# Patient Record
Sex: Female | Born: 2000
Health system: Southern US, Community
[De-identification: ages and names within clinical notes are randomized; demographics above are authoritative.]

---
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
## 2018-02-18 ENCOUNTER — Ambulatory Visit (INDEPENDENT_AMBULATORY_CARE_PROVIDER_SITE_OTHER): Payer: 59 | Admitting: Sports Medicine

## 2018-02-18 ENCOUNTER — Encounter: Payer: Self-pay | Admitting: Sports Medicine

## 2018-02-18 VITALS — BP 118/80 | Ht 62.0 in | Wt 160.0 lb

## 2018-02-18 DIAGNOSIS — S86899D Other injury of other muscle(s) and tendon(s) at lower leg level, unspecified leg, subsequent encounter: Secondary | ICD-10-CM | POA: Diagnosis not present

## 2018-02-18 NOTE — Progress Notes (Signed)
  Megan Herrera - 17 y.o. female MRN [id number]  Date of birth: 07/21/2000    SUBJECTIVE:      Chief Complaint:/ HPI:  The patient is a 17 year old female entering her second year of the [employer] who presents for orthotics.  She has been wearing these for several years for her shinsplints.  She reports great improvement with the orthotics.   ROS:     Patient denies any lower extremity pain, swelling, bruising, erythema.  No numbness or tingling  PERTINENT  PMH / PSH FH / / SH:  Past Medical, Surgical, Social, and Family History Reviewed & Updated in the EMR.  Pertinent findings include:  Negative no history of smoking  OBJECTIVE: BP 112/82   Ht 5\' 7"  (1.702 m)   Wt 160 lb (72.6 kg)   BMI 25.06 kg/m   Physical Exam:  Vital signs are reviewed.  GEN: Alert and oriented, NAD Pulm: Breathing unlabored PSY: normal mood, congruent affect  MSK: Moderate pes planus bilaterally  ASSESSMENT & PLAN:  1.  Custom orthotics - Patient was fitted for a : standard, cushioned, semi-rigid orthotic. The orthotic was heated and afterward the patient stood on the orthotic blank positioned on the orthotic stand. The patient was positioned in subtalar neutral position and 10 degrees of ankle dorsiflexion in a weight bearing stance. After completion of molding, a stable base was applied to the orthotic blank. The blank was ground to a stable position for weight bearing. Size: 9 Base: blue EVA Posting: none Additional orthotic padding: none  Total time spent with the patient was 30 minutes with greater than 50% of the time spent in face-to-face consultation discussing orthotic construction, instruction, and sitting. Gait was neutral with orthotics in place. Patient found them to be comfortable. Follow-up as needed.

## 2018-07-01 ENCOUNTER — Ambulatory Visit: Payer: Self-pay

## 2018-07-01 ENCOUNTER — Ambulatory Visit (INDEPENDENT_AMBULATORY_CARE_PROVIDER_SITE_OTHER): Payer: 59 | Admitting: Sports Medicine

## 2018-07-01 ENCOUNTER — Encounter: Payer: Self-pay | Admitting: Sports Medicine

## 2018-07-01 VITALS — BP 114/80 | Ht 67.0 in | Wt 160.0 lb

## 2018-07-01 DIAGNOSIS — M25521 Pain in right elbow: Secondary | ICD-10-CM

## 2018-07-01 DIAGNOSIS — S86899D Other injury of other muscle(s) and tendon(s) at lower leg level, unspecified leg, subsequent encounter: Secondary | ICD-10-CM

## 2018-07-01 NOTE — Progress Notes (Signed)
  Sol BlazingMargot A Jordan - 17 y.o. 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  She been wearing/require some time due to shinsplints.  Excellent response to sports orthotics Needs thin orthotic for marching shoes   ROS:     See HPI  PERTINENT  PMH / PSH FH / / SH:  Past Medical, Surgical, Social, and Family History Reviewed & Updated in the EMR.    OBJECTIVE: BP 114/80   Ht 5\' 7"  (1.702 m)   Wt 160 lb (72.6 kg)   BMI 25.06 kg/m   Physical Exam:  Vital signs are reviewed.  GEN: Alert and oriented, NAD Pulm: Breathing unlabored PSY: normal mood, congruent affect   Patient was fitted for a : standard, semi-rigid orthotic for dress shoes The orthotic was heated and afterward the patient stood on the orthotic blank positioned on the orthotic stand. The patient was positioned in subtalar neutral position and 10 degrees of ankle dorsiflexion in a weight bearing stance. After completion of molding, a stable base was applied to the orthotic blank. The blank was ground to a stable position for weight bearing. Size: 9 Base: 1/32 blue foam placed over heel for additional cushioning Posting: None Additional orthotic padding: None  Additionally, her old pair of orthotics was repaired, approximately a quarter sized hole was wearing into the orthotic at the head of the first metatarsal.  A 1/32 thick piece of blue foam was cut to fill in this hole.    ASSESSMENT & PLAN:  1.  History of medial tibial stress syndrome- custom orthotics created today as above  Total time spent with the patient was 25 minutes with greater than 50% of the time spent in face-to-face consultation discussing orthotic construction, instruction, and sitting. Gait was neutral with orthotics in place. Patient found them to be comfortable. Follow-up as needed.  I observed and examined the patient with the [facility]  Fellow and agree with assessment and plan.  Note reviewed and modified by me. Enid BaasKarl Nastasha Reising, MD

## 2018-08-30 DIAGNOSIS — J111 Influenza due to unidentified influenza virus with other respiratory manifestations: Secondary | ICD-10-CM | POA: Diagnosis not present

## 2018-12-24 ENCOUNTER — Ambulatory Visit (INDEPENDENT_AMBULATORY_CARE_PROVIDER_SITE_OTHER)

## 2018-12-24 ENCOUNTER — Encounter: Payer: Self-pay | Admitting: Sports Medicine

## 2018-12-24 ENCOUNTER — Other Ambulatory Visit: Payer: Self-pay

## 2018-12-24 ENCOUNTER — Ambulatory Visit (INDEPENDENT_AMBULATORY_CARE_PROVIDER_SITE_OTHER): Admitting: Sports Medicine

## 2018-12-24 DIAGNOSIS — M222X1 Patellofemoral disorders, right knee: Secondary | ICD-10-CM | POA: Diagnosis not present

## 2018-12-24 MED ORDER — MELOXICAM 15 MG PO TABS: ORAL_TABLET | ORAL | 3 refills | Status: AC

## 2018-12-24 MED FILL — MELOXICAM 15 MG TABLET: 15 | 30 days supply | Qty: 30 | Fill #0

## 2018-12-24 NOTE — Patient Instructions (Signed)
Hip abductors are mildly weak on the right. Needs to work aggressively on vastus medialis rehabilitation as well as hip abductor rehabilitation. Adding meloxicam for 2 weeks. Reaction knee brace. X-rays today. Raise bicycle seat so that knees do not past 90 degrees.

## 2018-12-24 NOTE — Assessment & Plan Note (Signed)
Hip abductors are mildly weak on the right. Needs to work aggressively on vastus medialis rehabilitation as well as hip abductor rehabilitation. Adding meloxicam for 2 weeks. Reaction knee brace. X-rays today. Raise bicycle seat so that knees do not past 90 degrees. She is going to be going back to [place] in a week, she is in the [employer]. She can touch base with me in 1 month to let me know how things are going.

## 2018-12-24 NOTE — Progress Notes (Signed)
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  She is considering flight training but not really sure what she wants to do when she joins the [employer].  She has been doing a lot of biking, and unfortunately has developed pain of the anterior knee, worse with going up and down steps, squatting, and biking.  She tends to bike with her seat low enough where her knees fully by past 90 degrees, she has not had any trauma, only minimal swelling, occasional squeaking sensations.  No mechanical symptoms.  I reviewed the past medical history, family history, social history, surgical history, and allergies today and no changes were needed.  Please see the problem list section below in epic for further details.  Past Medical History: No past medical history on file. Past Surgical History: No past surgical history on file. Social History: Social History   Socioeconomic History  . Marital status: Single    Spouse name: Not on file  . Number of children: Not on file  . Years of education: Not on file  . Highest education level: Not on file  Occupational History  . Not on file  Social Needs  . Financial resource strain: Not on file  . Food insecurity    Worry: Not on file    Inability: Not on file  . Transportation needs    Medical: Not on file    Non-medical: Not on file  Tobacco Use  . Smoking status: Never Smoker  . Smokeless tobacco: Never Used  Substance and Sexual Activity  . Alcohol use: No  . Drug use: No  . Sexual activity: Not on file  Lifestyle  . Physical activity    Days per week: Not on file    Minutes per session: Not on file  . Stress: Not on file  Relationships  . Social Herbalist on phone: Not on file    Gets together: Not on file    Attends religious service: Not on file    Active member of club or organization: Not on file     Attends meetings of clubs or organizations: Not on file    Relationship status: Not on file  Other Topics Concern  . Not on file  Social History Narrative  . Not on file   Family History: No family history on file. Allergies: Allergies  Allergen Reactions  . Cephalosporins    Medications: See med rec.  Review of Systems: No headache, visual changes, nausea, vomiting, diarrhea, constipation, dizziness, abdominal pain, skin rash, fevers, chills, night sweats, swollen lymph nodes, weight loss, chest pain, body aches, joint swelling, muscle aches, shortness of breath, mood changes, visual or auditory hallucinations.  Objective:    General: Well Developed, well nourished, and in no acute distress.  Neuro: Alert and oriented x3, extra-ocular muscles intact, sensation grossly intact.  HEENT: Normocephalic, atraumatic, pupils equal round reactive to light, neck supple, no masses, no lymphadenopathy, thyroid nonpalpable.  Skin: Warm and dry, no rashes noted.  Cardiac: Regular rate and rhythm, no murmurs rubs or gallops.  Respiratory: Clear to auscultation bilaterally. Not using accessory muscles, speaking in full sentences.  Abdominal: Soft, nontender, nondistended, positive bowel sounds, no masses, no organomegaly.  Right Knee: Normal to inspection with no erythema or effusion or obvious bony abnormalities. Palpation normal with no warmth or joint line  tenderness or patellar tenderness or condyle tenderness. ROM normal in flexion and extension and lower leg rotation. Ligaments with solid consistent endpoints including ACL, PCL, LCL, MCL. Negative Mcmurray's and provocative meniscal tests. Painful patellar compression with crepitus. Patellar and quadriceps tendons unremarkable. Hamstring and quadriceps strength is normal. Weak hip abductors on the right   Impression and Recommendations:    The patient was counselled, risk factors were discussed, anticipatory guidance given.   Patellofemoral syndrome, right Hip abductors are mildly weak on the right. Needs to work aggressively on vastus medialis rehabilitation as well as hip abductor rehabilitation. Adding meloxicam for 2 weeks. Reaction knee brace. X-rays today. Raise bicycle seat so that knees do not past 90 degrees. She is going to [place] to [place] in [place], she is in the [employer]. She can touch base with me in 1 month to let me know how things are going.   ___________________________________________ Ihor Austinhomas J. Benjamin Stainhekkekandam, M.D., ABFM., CAQSM. Primary Care and Sports Medicine [facility]  Adjunct Professor of Family Medicine  [employer]

## 2018-12-30 ENCOUNTER — Ambulatory Visit (INDEPENDENT_AMBULATORY_CARE_PROVIDER_SITE_OTHER): Admitting: Rehabilitative and Restorative Service Providers"

## 2018-12-30 ENCOUNTER — Encounter: Payer: Self-pay | Admitting: Rehabilitative and Restorative Service Providers"

## 2018-12-30 ENCOUNTER — Other Ambulatory Visit: Payer: Self-pay

## 2018-12-30 DIAGNOSIS — G8929 Other chronic pain: Secondary | ICD-10-CM | POA: Diagnosis not present

## 2018-12-30 DIAGNOSIS — M25561 Pain in right knee: Secondary | ICD-10-CM | POA: Diagnosis not present

## 2018-12-30 DIAGNOSIS — R29898 Other symptoms and signs involving the musculoskeletal system: Secondary | ICD-10-CM

## 2018-12-30 NOTE — Therapy (Addendum)
[facility] [place], [place], [address] Phone: [phone]   Fax:  [phone]  Physical Therapy Evaluation  Patient Details  Name: Megan Herrera MRN: [id number] Date of Birth: 2000-10-13 Referring Provider (PT): Dr Dianah Field    Encounter Date: 12/30/2018  PT End of Session - 12/30/18 1450    Visit Number  1    Number of Visits  1    Date for PT Re-Evaluation  12/30/18    PT Start Time  1205    PT Stop Time  1308   PT Time Calculation (min)  63 min    Activity Tolerance  Patient tolerated treatment well       History reviewed. No pertinent past medical history.  History reviewed. No pertinent surgical history.  There were no vitals filed for this visit.   Subjective Assessment - 12/30/18 1211    Subjective  Some pain in Rt knee in the fall after a 90 mile ride. She backed off and had some continued pain. She repeated a hard ride ~ 5 weeks ago with increased pain. Patient has tried rest from biking and running for short periods without significant improvement. Symptoms have persisted and worsened.    Patient Stated Goals  improve pain and learn exercises    Currently in Pain?  Yes    Pain Score  0-No pain    Pain Location  Knee    Pain Orientation  Right    Pain Descriptors / Indicators  Dull;Throbbing    Pain Type  Acute pain;Chronic pain    Pain Onset  More than a month ago    Pain Frequency  Intermittent    Aggravating Factors   biking; running; push ups; wt bearing Rt LE    Pain Relieving Factors  meds; ice; avoiding activities that irritate knee         [facility] PT Assessment - 12/30/18 0001      Assessment   Medical Diagnosis  Rt patellofemoral syndrome     Referring Provider (PT)  Dr Dianah Field     Onset Date/Surgical Date  11/16/18    Hand Dominance  Left    Next MD Visit  PRN     Prior Therapy  some in fall at school       Precautions   Precautions  None      Restrictions    Weight Bearing Restrictions  No      Balance Screen   Has the patient fallen in the past 6 months  No    Has the patient had a decrease in activity level because of a fear of falling?   No    Is the patient reluctant to leave their home because of a fear of falling?   No      Prior Function   Level of Independence  Independent    Vocation  Student    Vocation Requirements  biking team; running, PT       Observation/Other Assessments   Focus on Therapeutic Outcomes (FOTO)   33% limitation       Sensation   Additional Comments  WFL's per pt report       Posture/Postural Control   Posture Comments  stands with bilat knees in hyperextension       AROM   Overall AROM Comments  WFL's bilat LE's       Strength   Overall Strength Comments  WFL's - not tested resistively  Flexibility   Hamstrings  tight Rt > Lt    Quadriceps  tight Rt > Lt    ITB  tight Rt > Lt    Piriformis  tight Rt > Lt       Palpation   Patella mobility  decreased patellar mobility lateral to medial direction Rt compared to Lt     Palpation comment  tightness lateral quad distally at insertion to superior lateral patella       Special Tests   Other special tests  (+) patella grind; poor tracking of patella with knee extensin                 Objective measurements completed on examination: See above findings.      [facility] Adult PT Treatment/Exercise - 12/30/18 0001      Neuro Re-ed    Neuro Re-ed Details   worked on posture and alignment to stand without knees hyperextended       Knee/Hip Exercises: Hydrologist  Right;2 reps;30 seconds   supine with strap    Quad Stretch  Right;2 reps;30 seconds   prone with strap foam roll distal quad    Hip Flexor Stretch  Right;2 reps;30 seconds   supine - thomas; seated 30 sec x 2 each side    ITB Stretch  Right;2 reps;30 seconds   supine with strap    Piriformis Stretch  Right;2 reps;30 seconds   supine travell       Knee/Hip Exercises: Supine   Quad Sets  Strengthening;Right;10 reps   10 sec hold recruiting medial quad   Straight Leg Raise with External Rotation  Strengthening;Right;10 reps   10 sec hold quat set to SLR    Patellar Mobs  Rt - lateral to medial       Manual Therapy   Soft tissue mobilization  transverse friction/deep tissue work superior lateral patellar area at lateral quad banding     Kinesiotex  Inhibit Muscle;Facilitate Muscle      Kinesiotix   Inhibit Muscle   inhibit lateral quad    Facilitate Muscle   facilitate medial quad              PT Education - 12/30/18 1310    Education Details  transverse friction massage lateral quad at patella; patellar mobs; HEP; taping for correction of patellar alignment    Person(s) Educated  Patient    Methods  Explanation;Demonstration;Tactile cues;Verbal cues;Handout    Comprehension  Verbalized understanding;Returned demonstration;Verbal cues required;Tactile cues required       PT Short Term Goals - 12/30/18 1458      PT SHORT TERM GOAL #1   Title  Instruct patient in HEP 12/30/2018    Time  1    Period  Days    Status  Achieved                Plan - 12/30/18 1312    Clinical Impression Statement  Patient presents with 9-10 month history of Rt knee pain following overuse injury while biking. She has had periods of improvememt followed by increased symptoms with return to cycling or running. Patient presents today in [place] which she reports does help pain. She has poor standing posture - standing with knees hyperextended. She has lateral tracking patella on Rr; muscular tightness to palpation laterl quad at insertion; strength imbalance/weakness; pain with functional activities. Patient returns to college 01/01/19 and presents for HEP instruction. She was encouraged to seek Physical  Therapy when she returns to school to continue with knee rehab. She was also instructed to call with any concerns or questions re-  today's treatment.    Stability/Clinical Decision Making  Stable/Uncomplicated    Clinical Decision Making  Low    PT Frequency  One time visit    PT Duration  Other (comment)   1 time only visit   PT Treatment/Interventions  Patient/family education;ADLs/Self Care Home Management;Neuromuscular re-education;Therapeutic activities;Therapeutic exercise;Taping;Manual techniques    PT Next Visit Plan  1x/ only    PT Home Exercise Plan  [id number]    [id number] and Agree with Plan of Care  Patient       Patient will benefit from skilled therapeutic intervention in order to improve the following deficits and impairments:  Pain, Increased muscle spasms, Hypermobility, Decreased activity tolerance, Decreased strength  Visit Diagnosis: 1. Acute pain of right knee   2. Chronic pain of right knee   3. Other symptoms and signs involving the musculoskeletal system        Problem List Patient Active Problem List   Diagnosis Date Noted  . Patellofemoral syndrome, right 12/24/2018  . Shin splints 02/14/2015    Saranya Harlin Nilda Simmer PT, MPH  12/30/2018, 3:02 PM  [facility] [place] [place], [place], [address] Phone: [phone]   Fax:  [phone]  Name: LEDORA DELKER MRN: [id number] Date of Birth: September 12, 2000   PHYSICAL THERAPY DISCHARGE SUMMARY  Visits from Start of Care: 1 time only   Current functional level related to goals / functional outcomes: See note   Remaining deficits: Unchanged    Education / Equipment: HEP Plan: Patient agrees to discharge.  Patient goals were met. Patient is being discharged due to meeting the stated rehab goals.  ?????    Balthazar Dooly P. Helene Kelp PT, MPH 12/30/18 3:04 PM

## 2018-12-30 NOTE — Patient Instructions (Signed)
Transverse friction massage   Patellar mobilization - lateral to medial  Access Code: [email or web address]  URL: [email or web address]  Date: 12/30/2018  Prepared by: Gillermo Murdoch   Exercises  Supine Hamstring Stretch with Strap - 10 reps - 1 sets - 30 seconds hold - 2x daily - 7x weekly  Supine ITB Stretch with Strap - 3 reps - 1 sets - 30 seconds hold - 2x daily - 7x weekly  Supine Piriformis Stretch with Leg Straight - 3 reps - 1 sets - 30 seconds hold - 2x daily - 7x weekly  Prone Quadriceps Stretch with Strap - 3 reps - 1 sets - 30 seconds hold - 2x daily - 7x weekly  Hip Flexor Stretch at Edge of Bed - 3 reps - 1 sets - 30 seconds hold - 2x daily - 7x weekly  Seated Hip Flexor Stretch - 3 reps - 1 sets - 30 seconds hold - 2x daily - 7x weekly  Seated Hamstring Stretch - 3 reps - 1 sets - 30 sec hold - 2x daily - 7x weekly  Supine Quad Set - 10 reps - 1-3 sets - 10 sec hold - 2x daily - 7x weekly  Straight Leg Raise with External Rotation - 10 reps - 1 sets - 5-10 sec hold - 2x daily - 7x weekly

## 2019-08-27 IMAGING — DX RIGHT KNEE - COMPLETE 4+ VIEW
4 series · 4 of 4 positions shown · non-contrast
Comparison: None.

CLINICAL DATA: Right medial knee pain and swelling, frequent bike
riding

EXAM:
LEFT KNEE - 1-2 VIEW; RIGHT KNEE - COMPLETE 4+ VIEW

[tunnel]
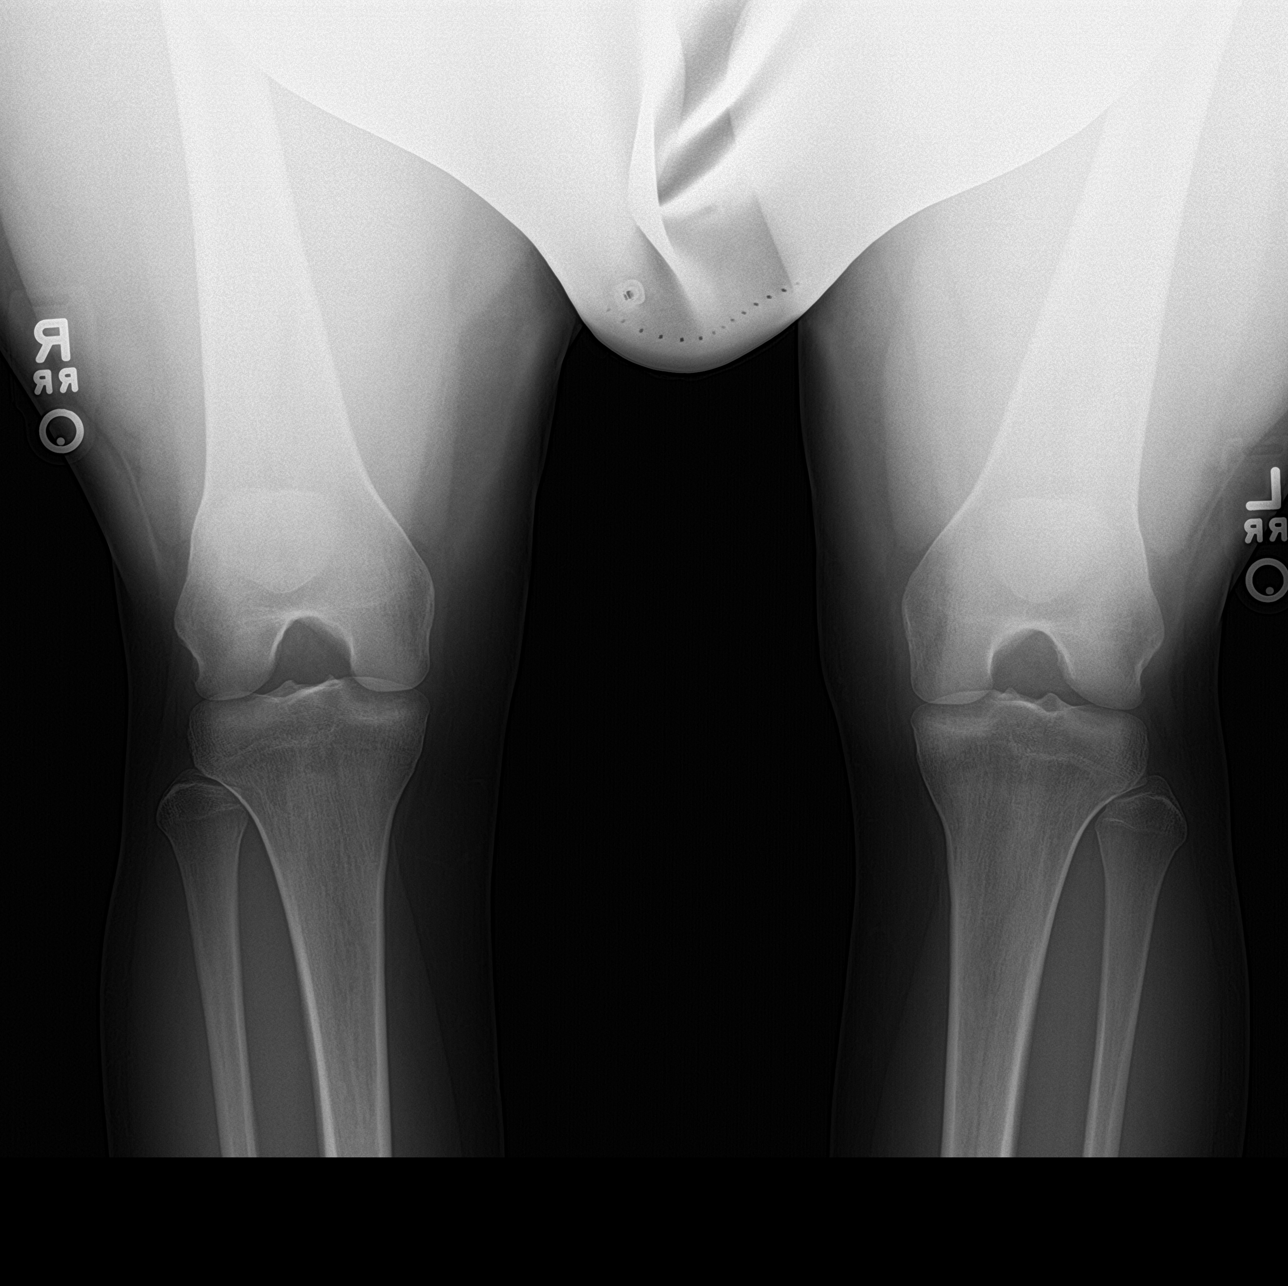

[knee lat]
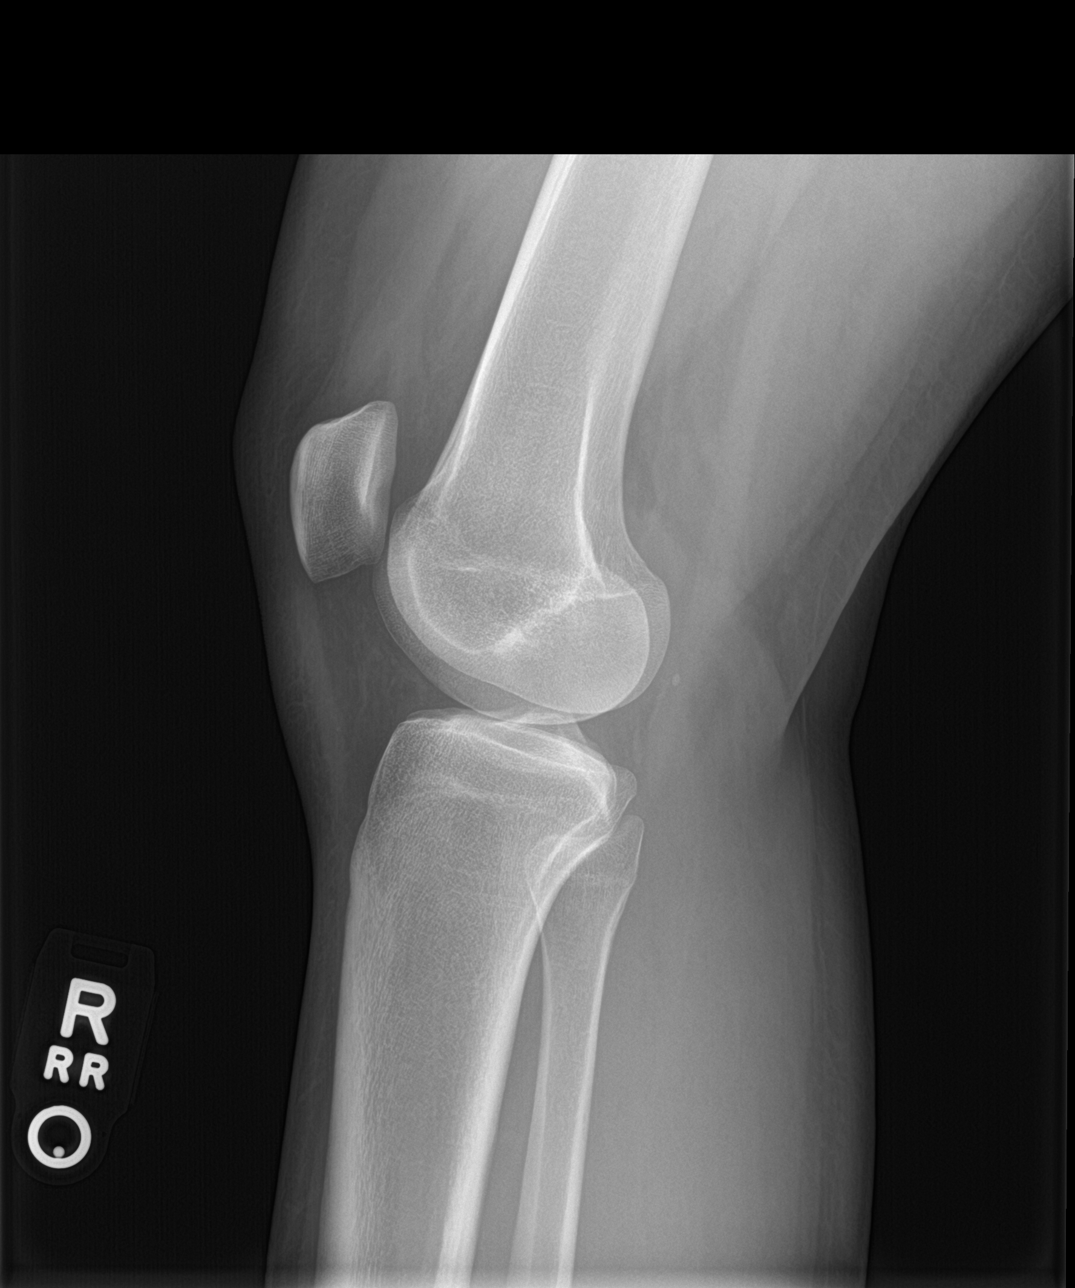

[knee sunrise]
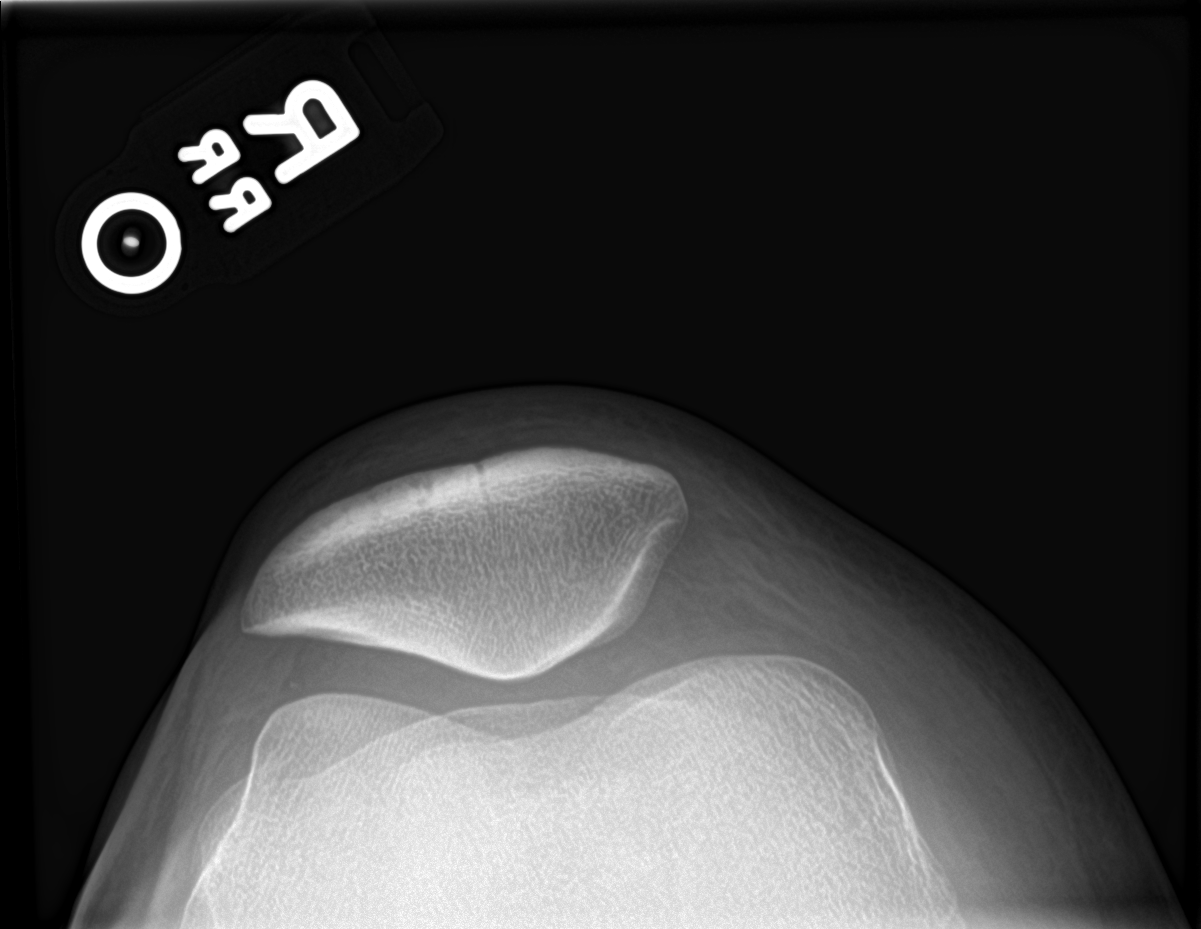

[knee ap bilat standing]
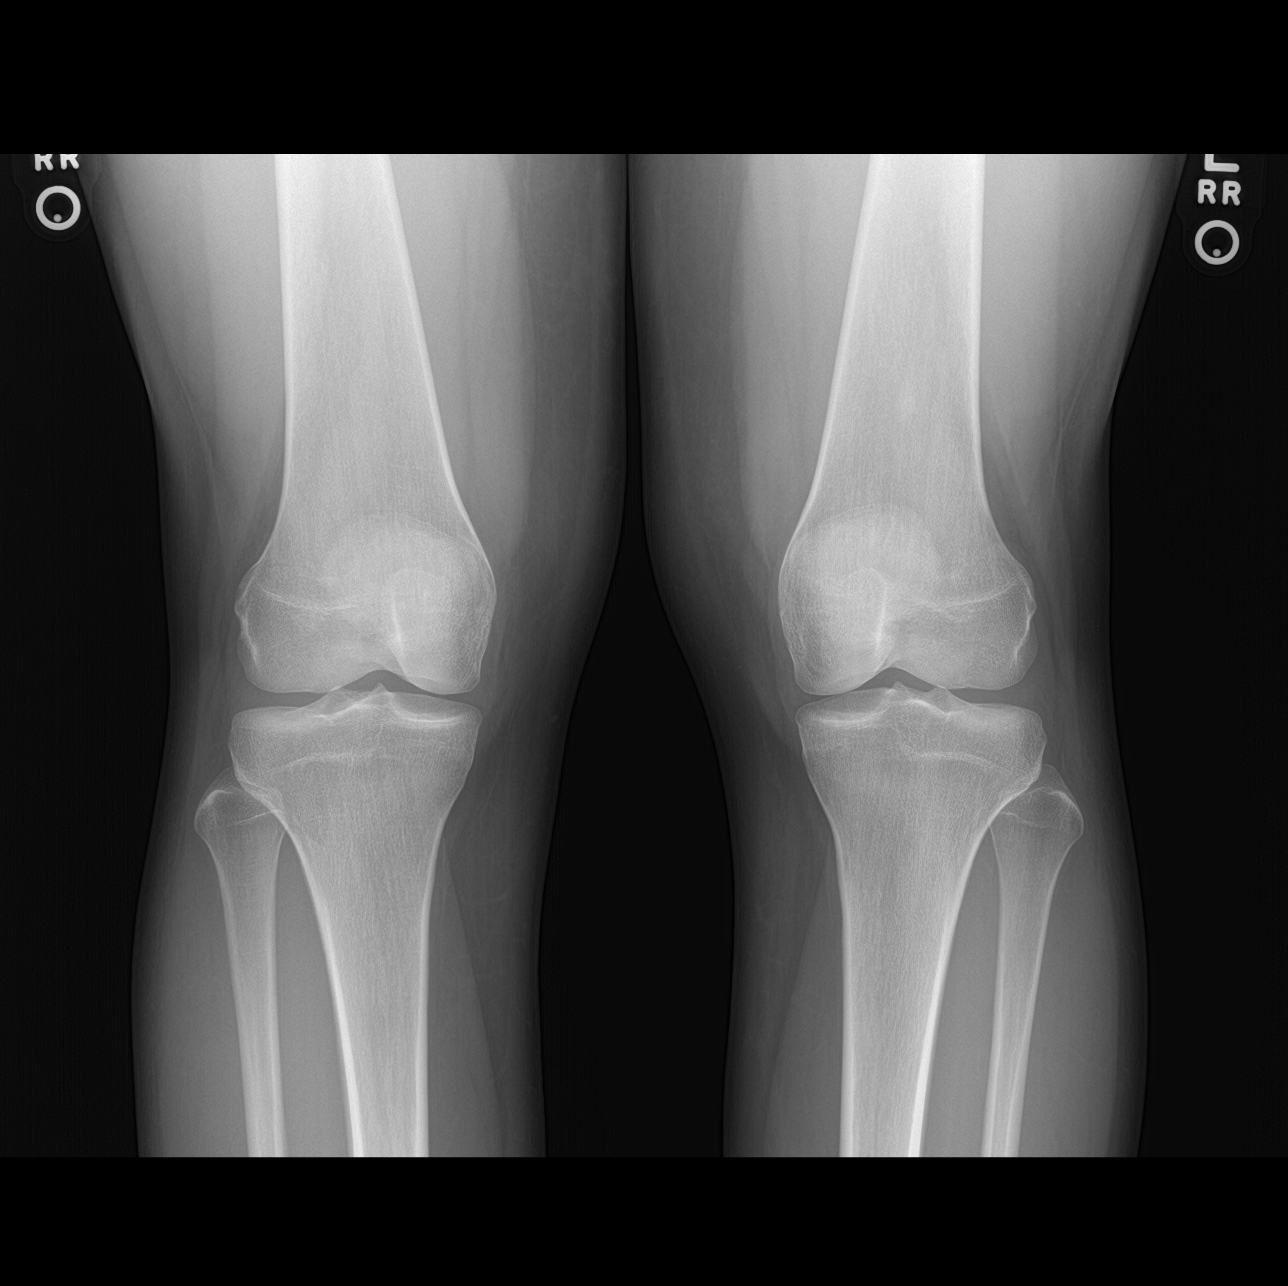

[4 of 4 positions shown; findings below may reference images not displayed]

FINDINGS: No fracture or dislocation of the right knee. Joint spaces are well
preserved. There is no right knee joint effusion. The left knee is
unremarkable seen in frontal and tunnel views only.
IMPRESSION: No fracture or dislocation of the right knee. Joint spaces are well
preserved. There is no right knee joint effusion. The left knee is
unremarkable seen in frontal and tunnel views only.

## 2020-02-18 DIAGNOSIS — J019 Acute sinusitis, unspecified: Secondary | ICD-10-CM | POA: Diagnosis not present

## 2020-07-02 ENCOUNTER — Ambulatory Visit
